# Patient Record
Sex: Female | Born: 1995 | Hispanic: Yes | Marital: Single | Smoking: Never smoker
Health system: Southern US, Community
[De-identification: ages and names within clinical notes are randomized; demographics above are authoritative.]

## PROBLEM LIST (undated history)

## (undated) ENCOUNTER — Inpatient Hospital Stay (HOSPITAL_COMMUNITY): Payer: Self-pay

## (undated) DIAGNOSIS — N83209 Unspecified ovarian cyst, unspecified side: Secondary | ICD-10-CM

## (undated) DIAGNOSIS — F329 Major depressive disorder, single episode, unspecified: Secondary | ICD-10-CM

## (undated) DIAGNOSIS — F32A Depression, unspecified: Secondary | ICD-10-CM

## (undated) DIAGNOSIS — F419 Anxiety disorder, unspecified: Secondary | ICD-10-CM

## (undated) HISTORY — PX: NO PAST SURGERIES: SHX2092

---
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
## 2017-12-08 ENCOUNTER — Other Ambulatory Visit: Payer: Self-pay | Admitting: Family Medicine

## 2017-12-08 DIAGNOSIS — R102 Pelvic and perineal pain: Secondary | ICD-10-CM

## 2017-12-08 DIAGNOSIS — N926 Irregular menstruation, unspecified: Secondary | ICD-10-CM

## 2018-01-16 ENCOUNTER — Ambulatory Visit
Admission: RE | Admit: 2018-01-16 | Discharge: 2018-01-16 | Disposition: A | Discharge status: Home / Self Care | Payer: Medicaid Other | Source: Ambulatory Visit | Attending: Family Medicine | Admitting: Family Medicine

## 2018-01-16 DIAGNOSIS — N926 Irregular menstruation, unspecified: Secondary | ICD-10-CM | POA: Diagnosis present

## 2018-01-16 DIAGNOSIS — R102 Pelvic and perineal pain: Secondary | ICD-10-CM | POA: Insufficient documentation

## 2018-10-26 ENCOUNTER — Encounter: Payer: Self-pay | Admitting: Emergency Medicine

## 2018-10-26 ENCOUNTER — Emergency Department
Admission: EM | Admit: 2018-10-26 | Discharge: 2018-10-27 | Disposition: A | Discharge status: Home / Self Care | Payer: Self-pay | Attending: Emergency Medicine | Admitting: Emergency Medicine

## 2018-10-26 ENCOUNTER — Other Ambulatory Visit: Payer: Self-pay

## 2018-10-26 DIAGNOSIS — Z79899 Other long term (current) drug therapy: Secondary | ICD-10-CM | POA: Insufficient documentation

## 2018-10-26 DIAGNOSIS — R1084 Generalized abdominal pain: Secondary | ICD-10-CM | POA: Insufficient documentation

## 2018-10-26 NOTE — ED Triage Notes (Signed)
Pt arrives ambulatory to triage with c/o lower abdominal pain x 1 day. Pt denies vomiting but states that she has been nauseated all day. Pt is in NAD.

## 2018-10-27 ENCOUNTER — Emergency Department: Payer: Self-pay

## 2018-10-27 LAB — COMPREHENSIVE METABOLIC PANEL
ALBUMIN: 4.7 g/dL (ref 3.5–5.0)
ALT: 14 U/L (ref 0–44)
AST: 22 U/L (ref 15–41)
Alkaline Phosphatase: 76 U/L (ref 38–126)
Anion gap: 10 (ref 5–15)
BILIRUBIN TOTAL: 0.5 mg/dL (ref 0.3–1.2)
BUN: 13 mg/dL (ref 6–20)
CO2: 25 mmol/L (ref 22–32)
CREATININE: 0.72 mg/dL (ref 0.44–1.00)
Calcium: 10.2 mg/dL (ref 8.9–10.3)
Chloride: 101 mmol/L (ref 98–111)
GFR calc Af Amer: >60 mL/min (ref >60)
GLUCOSE: 109 mg/dL — AB (ref 70–99)
POTASSIUM: 2.9 mmol/L — AB (ref 3.5–5.1)
Sodium: 136 mmol/L (ref 135–145)
TOTAL PROTEIN: 9.7 g/dL — AB (ref 6.5–8.1)

## 2018-10-27 LAB — URINALYSIS, COMPLETE (UACMP) WITH MICROSCOPIC
Bilirubin Urine: NEGATIVE
Glucose, UA: NEGATIVE mg/dL
Hgb urine dipstick: NEGATIVE
Ketones, ur: NEGATIVE mg/dL
Nitrite: NEGATIVE
PH: 6 (ref 5.0–8.0)
PROTEIN: NEGATIVE mg/dL
Specific Gravity, Urine: 1.01 (ref 1.005–1.030)

## 2018-10-27 LAB — CBC WITH DIFFERENTIAL/PLATELET
Abs Immature Granulocytes: 0.04 10*3/uL (ref 0.00–0.07)
BASOS ABS: 0 10*3/uL (ref 0.0–0.1)
BASOS PCT: 0 %
Eosinophils Absolute: 0.1 10*3/uL (ref 0.0–0.5)
Eosinophils Relative: 1 %
HCT: 41.9 % (ref 36.0–46.0)
Hemoglobin: 13.6 g/dL (ref 12.0–15.0)
Immature Granulocytes: 0 %
Lymphocytes Relative: 27 %
Lymphs Abs: 2.8 10*3/uL (ref 0.7–4.0)
MCH: 27.4 pg (ref 26.0–34.0)
MCHC: 32.5 g/dL (ref 30.0–36.0)
MCV: 84.5 fL (ref 80.0–100.0)
Monocytes Absolute: 0.5 10*3/uL (ref 0.1–1.0)
Monocytes Relative: 5 %
NEUTROS ABS: 6.7 10*3/uL (ref 1.7–7.7)
NRBC: 0 % (ref 0.0–0.2)
Neutrophils Relative %: 67 %
Platelets: 265 10*3/uL (ref 150–400)
RBC: 4.96 MIL/uL (ref 3.87–5.11)
RDW: 13.6 % (ref 11.5–15.5)
WBC: 10.2 10*3/uL (ref 4.0–10.5)

## 2018-10-27 LAB — LIPASE, BLOOD: Lipase: 32 U/L (ref 11–51)

## 2018-10-27 LAB — POCT PREGNANCY, URINE: PREG TEST UR: NEGATIVE

## 2018-10-27 MED ORDER — ONDANSETRON HCL 4 MG/2ML IJ SOLN
INTRAMUSCULAR | Status: AC
  Filled 2018-10-27: qty 2

## 2018-10-27 MED ORDER — ONDANSETRON HCL 4 MG/2ML IJ SOLN
4.0000 mg | Freq: Once | INTRAMUSCULAR | Status: AC
  Administered 2018-10-27: 4 mg via INTRAVENOUS

## 2018-10-27 MED ORDER — IOPAMIDOL (ISOVUE-300) INJECTION 61%
100.0000 mL | Freq: Once | INTRAVENOUS | Status: AC | PRN
  Administered 2018-10-27: 100 mL via INTRAVENOUS

## 2018-10-27 MED ORDER — IOPAMIDOL (ISOVUE-300) INJECTION 61%
15.0000 mL | INTRAVENOUS | Status: AC
  Administered 2018-10-27 (×2): 15 mL via ORAL

## 2018-10-27 NOTE — ED Provider Notes (Signed)
[facility] Emergency Department Provider Note   ____________________________________________   First MD Initiated Contact with Patient 10/26/18 2347     (approximate)  I have reviewed the triage vital signs and the nursing notes.   HISTORY  Chief Complaint Abdominal Pain   HPI Rachel Mathews is a 22 y.o. female patient reports she was woken up by abdominal pain at 4:00 this morning.  Pain has persisted in spite of Pepto-Bismol and Maalox.  Patient has a small amount of nausea but has not vomited and has not had diarrhea.  Pain is tight and occasionally sharp.  It does not feel crampy.  It is centered in the middle of the abdomen including and just above the umbilicus.  Nothing seems to make it better or worse.  Patient is not sure what brought it on.  History reviewed. No pertinent past medical history.  There are no active problems to display for this patient.   History reviewed. No pertinent surgical history.  Prior to Admission medications   Medication Sig Start Date End Date Taking? Authorizing Provider  Triamcinolone Acetonide (TRIAMCINOLONE 0.1 % CREAM : EUCERIN) CREA Apply 1 application topically 2 (two) times daily as needed. 1 pound. 50/50 mix 10/23/14   Rodolph Bongorey, Evan S, MD    Allergies Patient has no known allergies.  No family history on file.  Social History Social History   Tobacco Use  . Smoking status: Never Smoker  . Smokeless tobacco: Never Used  Substance Use Topics  . Alcohol use: No  . Drug use: Never    Review of Systems  Constitutional: No fever/chills Eyes: No visual changes. ENT: No sore throat. Cardiovascular: Denies chest pain. Respiratory: Denies shortness of breath. Gastrointestinal:abdominal pain.   nausea, no vomiting.  No diarrhea.  No constipation. Genitourinary: Negative for dysuria. Musculoskeletal: Negative for back pain. Skin: Negative for rash. Neurological: Negative for headaches, focal  weakness   ____________________________________________   PHYSICAL EXAM:  VITAL SIGNS: ED Triage Vitals  Enc Vitals Group     BP 10/26/18 2342 135/75     Pulse Rate 10/26/18 2342 95     Resp 10/26/18 2342 18     Temp 10/26/18 2342 98.2 F (36.8 C)     Temp Source 10/26/18 2342 Oral     SpO2 10/26/18 2342 98 %     Weight 10/26/18 2340 120 lb (54.4 kg)     Height 10/26/18 2340 5\' 2"  (1.575 m)     Head Circumference --      Peak Flow --      Pain Score 10/26/18 2340 5     Pain Loc --      Pain Edu? --      Excl. in GC? --     Constitutional: Alert and oriented. Well appearing and in no acute distress. Eyes: Conjunctivae are normal.  Head: Atraumatic. Nose: No congestion/rhinnorhea. Mouth/Throat: Mucous membranes are moist.  Oropharynx non-erythematous. Neck: No stridor. Cardiovascular: Normal rate, regular rhythm. Grossly normal heart sounds.  Good peripheral circulation. Respiratory: Normal respiratory effort.  No retractions. Lungs CTAB. Gastrointestinal: Soft diffusely mildly tender.  No suprapubic tenderness no right lower quadrant tenderness no masses.  No distention. No abdominal bruits. No CVA tenderness. Musculoskeletal: No lower extremity tenderness nor edema.  No joint effusions. Neurologic:  Normal speech and language. No gross focal neurologic deficits are appreciated.  Skin:  Skin is warm, dry and intact. No rash noted. Psychiatric: Mood and affect are normal. Speech and behavior are normal.  ____________________________________________   LABS (all labs ordered are listed, but only abnormal results are displayed)  Labs Reviewed  COMPREHENSIVE METABOLIC PANEL - Abnormal; Notable for the following components:      Result Value   Potassium 2.9 (*)    Glucose, Bld 109 (*)    Total Protein 9.7 (*)    All other components within normal limits  URINALYSIS, COMPLETE (UACMP) WITH MICROSCOPIC - Abnormal; Notable for the following components:   Color, Urine  YELLOW (*)    APPearance HAZY (*)    Leukocytes, UA SMALL (*)    Bacteria, UA RARE (*)    All other components within normal limits  LIPASE, BLOOD  CBC WITH DIFFERENTIAL/PLATELET  POC URINE PREG, ED  POCT PREGNANCY, URINE   ____________________________________________  EKG   ____________________________________________  RADIOLOGY  ED MD interpretation:  Official radiology report(s): Ct Abdomen Pelvis W Contrast  Result Date: 10/27/2018 CLINICAL DATA:  Acute abdominal pain. EXAM: CT ABDOMEN AND PELVIS WITH CONTRAST TECHNIQUE: Multidetector CT imaging of the abdomen and pelvis was performed using the standard protocol following bolus administration of intravenous contrast. CONTRAST:  ISOVUE-300 IOPAMIDOL (ISOVUE-300) INJECTION 61% COMPARISON:  Pelvic ultrasound 01/16/2018 FINDINGS: Lower chest: The lung bases are clear. Hepatobiliary: No focal liver abnormality is seen. No gallstones, gallbladder wall thickening, or biliary dilatation. Pancreas: No ductal dilatation or inflammation. Spleen: Normal in size without focal abnormality. Tiny splenule inferiorly. Adrenals/Urinary Tract: Normal adrenal glands. No hydronephrosis or perinephric edema. Homogeneous renal enhancement. Urinary bladder is physiologically distended without wall thickening. Stomach/Bowel: Stomach is within normal limits. Appendix is normal, for example image 32 series 5. No evidence of bowel wall thickening, distention, or inflammatory changes. Administered enteric contrast reaches the hepatic flexure of the colon. Moderate stool burden. No abnormal rectal distention. Vascular/Lymphatic: No significant vascular findings are present. No enlarged abdominal or pelvic lymph nodes. Reproductive: Simple 3.8 cm cyst in the left ovary is most likely benign. Uterus and right ovary appear normal. Trace free fluid in the pelvis. Other: No free air. No abdominal ascites. Musculoskeletal: There are no acute or suspicious osseous  abnormalities. IMPRESSION: 1. Simple 3.8 cm cyst in the left ovary, most certainly benign. In the absence of left lower quadrant symptoms, no additional imaging is needed. 2. No other acute abnormality in the abdomen/pelvis. Electronically Signed   By: Narda Rutherford M.D.   On: 10/27/2018 03:09    ____________________________________________   PROCEDURES  Procedure(s) performed:  Procedures  Critical Care performed:   ____________________________________________   INITIAL IMPRESSION / ASSESSMENT AND PLAN / ED COURSE  CT shows an ovarian cyst and some stool in the colon.  Reexamined the patient's abdomen shows the pain is improved there is no pain in the left lower quadrant.  Most of the pain has been diffuse and higher up in the abdomen.  I do not believe we need to work on evaluating the cyst.  Patient can follow-up with her OB/GYN doctor for that.  She will return here if she is worse.  We will try an over-the-counter laxative to see if that helps as well.        ____________________________________________   FINAL CLINICAL IMPRESSION(S) / ED DIAGNOSES  Final diagnoses:  Generalized abdominal pain     ED Discharge Orders    None       Note:  This document was prepared using Dragon voice recognition software and may include unintentional dictation errors.    Arnaldo Natal, MD 10/27/18 [phone]

## 2018-10-27 NOTE — ED Notes (Signed)
Pt returned from CT, vomited once

## 2018-10-27 NOTE — Discharge Instructions (Addendum)
Please return for worse pain fever vomiting.  The CAT scan only showed some stool in the colon and an ovarian cyst.  You could try an over-the-counter laxative see if that helps.  If it does not or if you get any worse please return to follow-up with your regular doctor.  You could also follow-up with your OB/GYN for the ovarian cyst that we saw on CT scan.

## 2018-10-27 NOTE — ED Notes (Signed)
Patient transported to CT 

## 2018-10-27 NOTE — ED Notes (Signed)
ED Provider at bedside.  Pt reports peri-umbilicus sharp cramping pain since 4am Friday, pt denies N/V/D/ or dysuria, pt denies any abdominal surgery, L[facility] irregular - [facility]  Family at bedside

## 2018-10-27 NOTE — ED Notes (Signed)
Peripheral IV discontinued. Catheter intact. No signs of infiltration or redness. Gauze applied to IV site.   Discharge instructions reviewed with patient. Questions fielded by this RN. Patient verbalizes understanding of instructions. Patient discharged home in stable condition per Dr Malinda. No acute distress noted at time of discharge.    

## 2019-01-16 IMAGING — CT CT ABD-PELV W/ CM
2 of 4 series · 15 of 46 positions shown, 17 images · IV contrast (APPLIED)
Comparison: Pelvic ultrasound 01/16/2018

CLINICAL DATA: Acute abdominal pain.

EXAM:
CT ABDOMEN AND PELVIS WITH CONTRAST
TECHNIQUE: Multidetector CT imaging of the abdomen and pelvis was performed
using the standard protocol following bolus administration of
intravenous contrast.
CONTRAST:  100mL LXC8TL-MXX IOPAMIDOL (LXC8TL-MXX) INJECTION 61%

[Series 2: routine abd/pel with · axial · 0.66mm/px · z∈[-932,-506]mm · 12 of 97 slices shown, 14 images]
[im 8/97  soft-tissue]
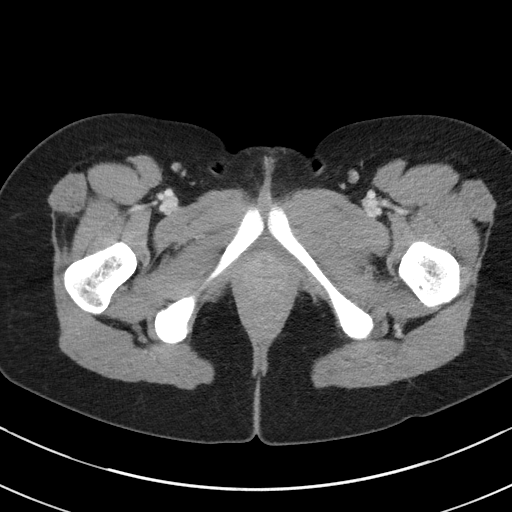
[im 8/97  bone]
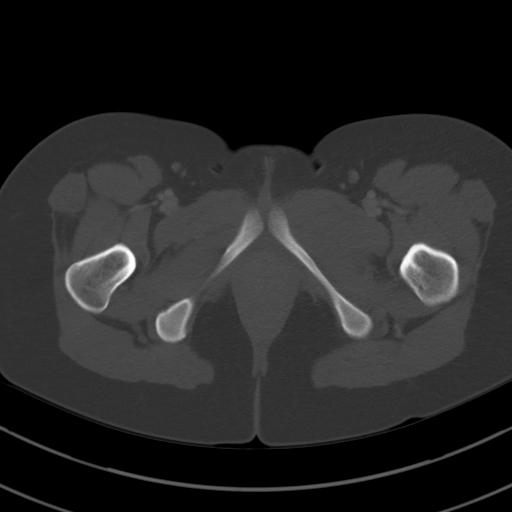
[im 16/97  soft-tissue]
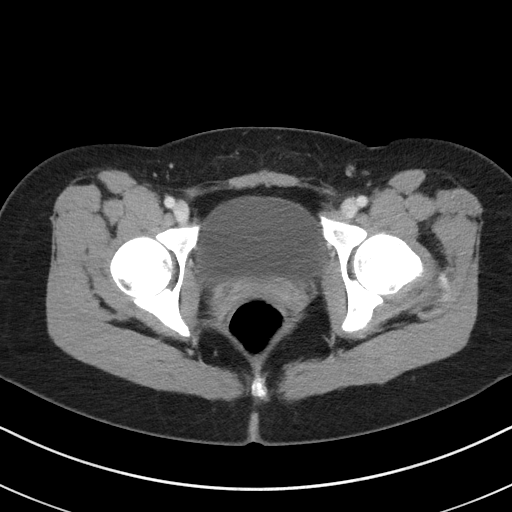
[im 24/97  soft-tissue]
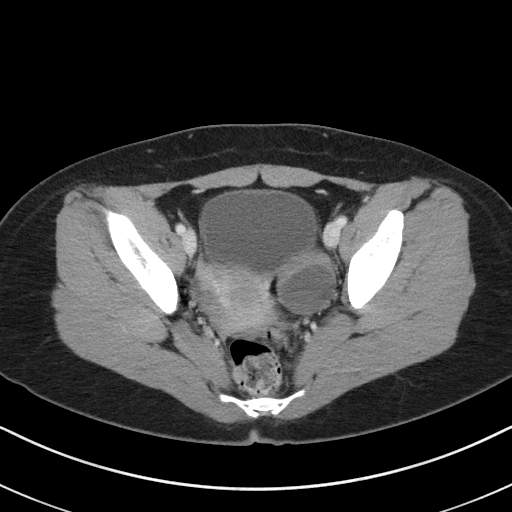
[im 31/97  soft-tissue]
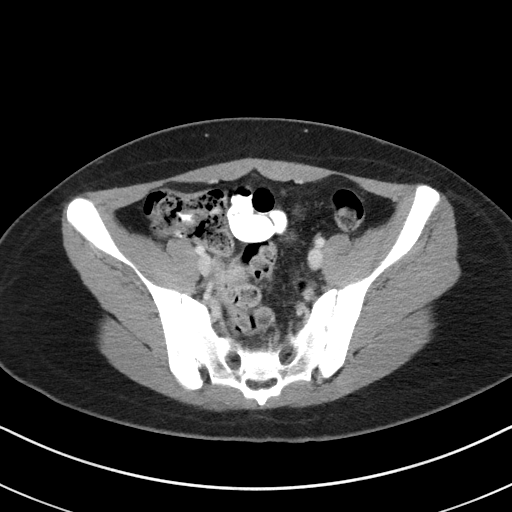
[im 39/97  soft-tissue]
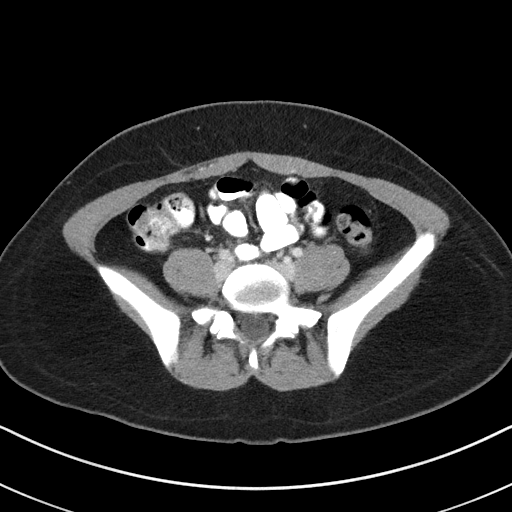
[im 47/97  soft-tissue]
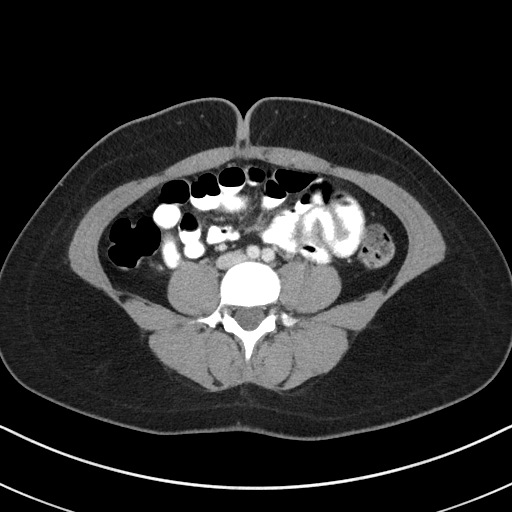
[im 54/97  soft-tissue]
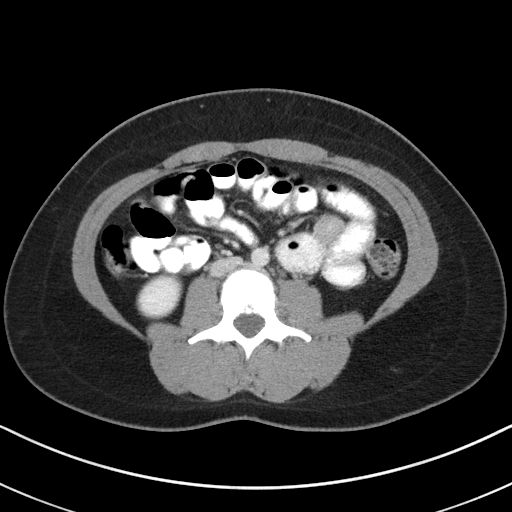
[im 62/97  soft-tissue]
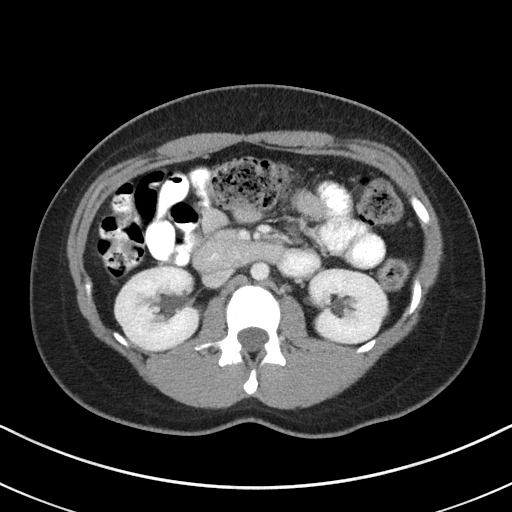
[im 70/97  soft-tissue]
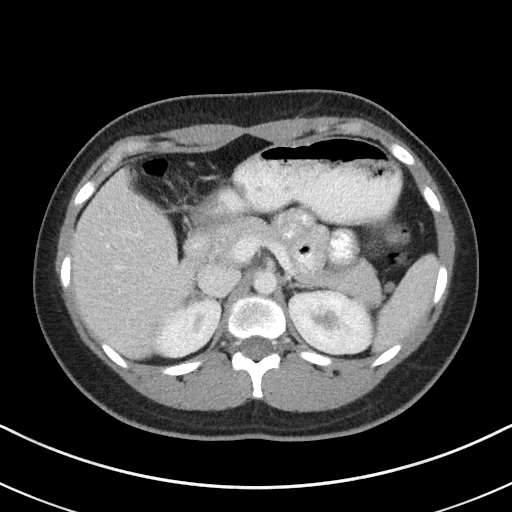
[im 70/97  bone]
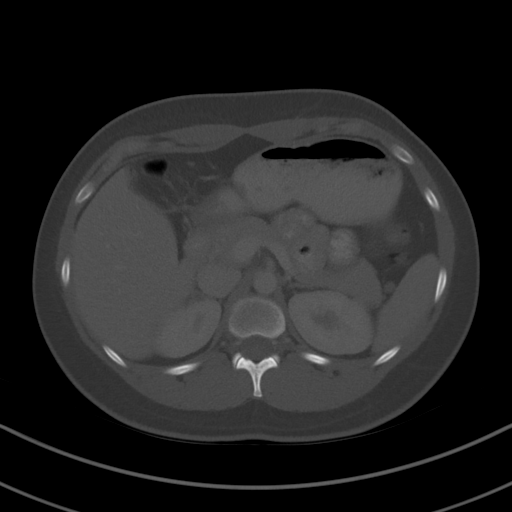
[im 77/97  soft-tissue]
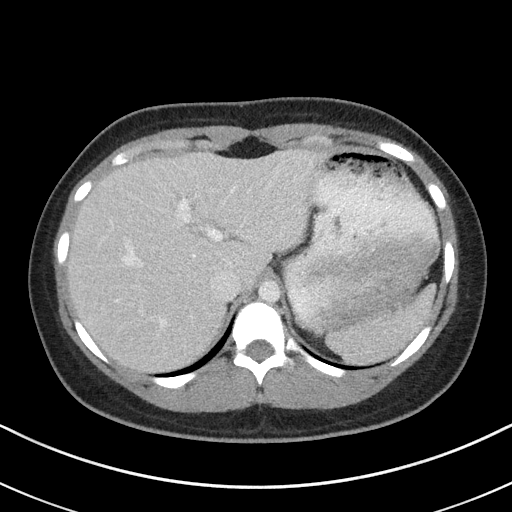
[im 85/97  soft-tissue]
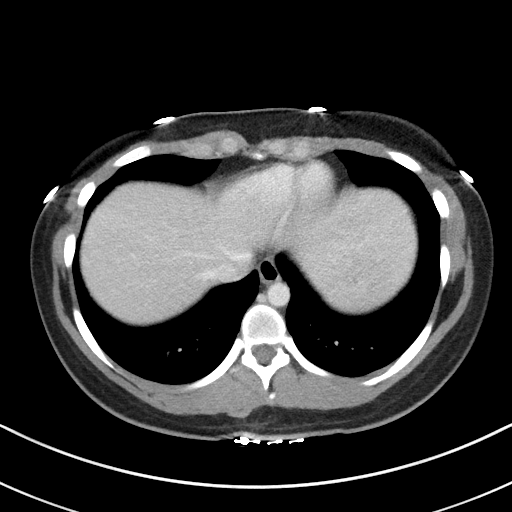
[im 93/97  soft-tissue]
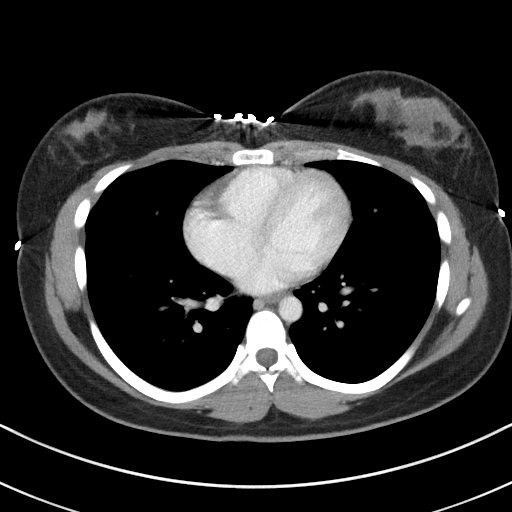

[Series 5: coronal st · coronal · 0.66mm/px · 3 of 69 slices shown]
[im 23/69  soft-tissue]
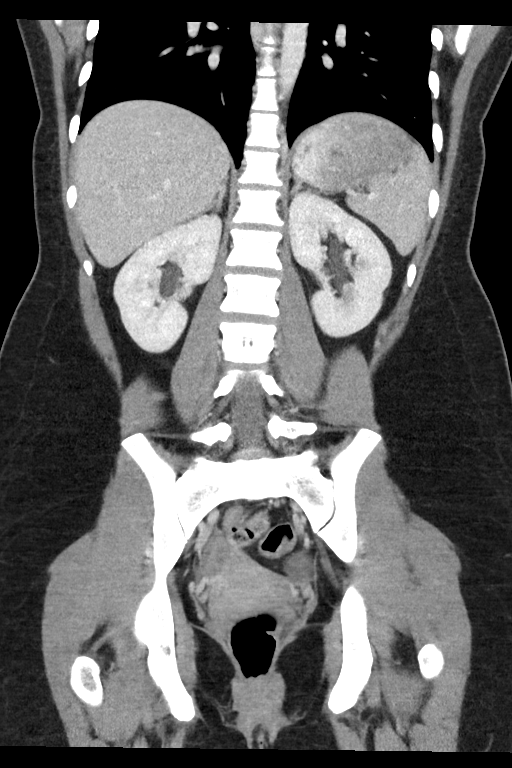
[im 31/69  soft-tissue]
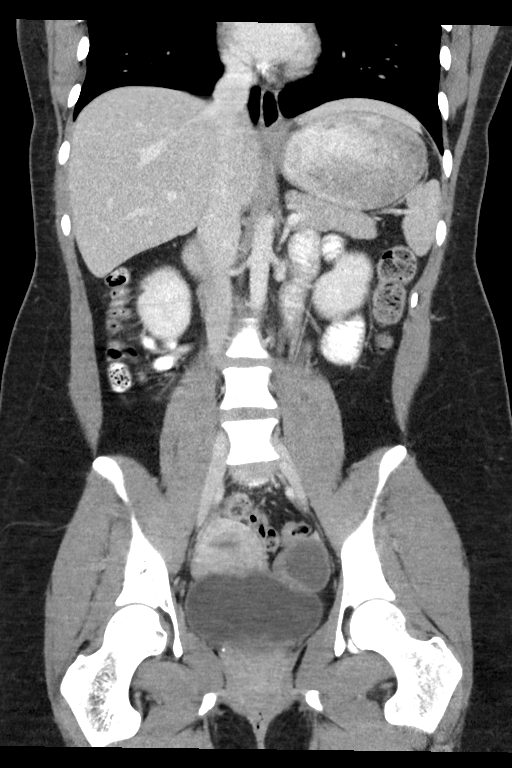
[im 38/69  soft-tissue]
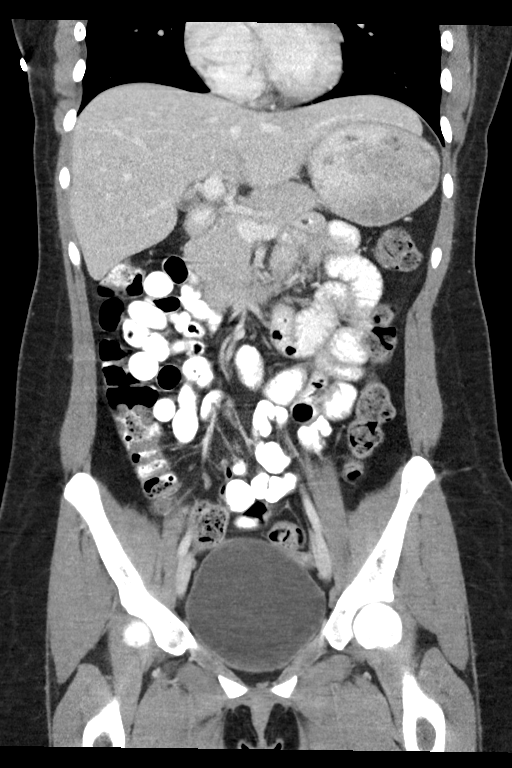

[15 of 46 positions shown; findings below may reference images not displayed]

FINDINGS: Lower chest: The lung bases are clear.

Hepatobiliary: No focal liver abnormality is seen. No gallstones,
gallbladder wall thickening, or biliary dilatation.

Pancreas: No ductal dilatation or inflammation.

Spleen: Normal in size without focal abnormality. Tiny splenule
inferiorly.

Adrenals/Urinary Tract: Normal adrenal glands. No hydronephrosis or
perinephric edema. Homogeneous renal enhancement. Urinary bladder is
physiologically distended without wall thickening.

Stomach/Bowel: Stomach is within normal limits. Appendix is normal,
for example image 32 series 5. No evidence of bowel wall thickening,
distention, or inflammatory changes. Administered enteric contrast
reaches the hepatic flexure of the colon. Moderate stool burden. No
abnormal rectal distention.

Vascular/Lymphatic: No significant vascular findings are present. No
enlarged abdominal or pelvic lymph nodes.

Reproductive: Simple 3.8 cm cyst in the left ovary is most likely
benign. Uterus and right ovary appear normal. Trace free fluid in
the pelvis.

Other: No free air. No abdominal ascites.

Musculoskeletal: There are no acute or suspicious osseous
abnormalities.
IMPRESSION: 1. Simple 3.8 cm cyst in the left ovary, most certainly benign. In
the absence of left lower quadrant symptoms, no additional imaging
is needed.
2. No other acute abnormality in the abdomen/pelvis.

## 2019-01-31 ENCOUNTER — Other Ambulatory Visit: Payer: Self-pay

## 2019-01-31 ENCOUNTER — Encounter (HOSPITAL_COMMUNITY): Payer: Self-pay | Admitting: *Deleted

## 2019-01-31 ENCOUNTER — Inpatient Hospital Stay (HOSPITAL_COMMUNITY)
Admission: AD | Admit: 2019-01-31 | Discharge: 2019-01-31 | Disposition: A | Discharge status: Home / Self Care | Payer: BLUE CROSS/BLUE SHIELD | Attending: Obstetrics & Gynecology | Admitting: Obstetrics & Gynecology

## 2019-01-31 ENCOUNTER — Inpatient Hospital Stay (HOSPITAL_COMMUNITY): Payer: BLUE CROSS/BLUE SHIELD

## 2019-01-31 DIAGNOSIS — R1031 Right lower quadrant pain: Secondary | ICD-10-CM | POA: Diagnosis not present

## 2019-01-31 DIAGNOSIS — N9489 Other specified conditions associated with female genital organs and menstrual cycle: Secondary | ICD-10-CM | POA: Insufficient documentation

## 2019-01-31 DIAGNOSIS — M549 Dorsalgia, unspecified: Secondary | ICD-10-CM | POA: Insufficient documentation

## 2019-01-31 DIAGNOSIS — M79605 Pain in left leg: Secondary | ICD-10-CM | POA: Diagnosis not present

## 2019-01-31 DIAGNOSIS — M79604 Pain in right leg: Secondary | ICD-10-CM | POA: Diagnosis not present

## 2019-01-31 DIAGNOSIS — O26891 Other specified pregnancy related conditions, first trimester: Secondary | ICD-10-CM

## 2019-01-31 DIAGNOSIS — R102 Pelvic and perineal pain: Secondary | ICD-10-CM | POA: Diagnosis not present

## 2019-01-31 DIAGNOSIS — O26899 Other specified pregnancy related conditions, unspecified trimester: Secondary | ICD-10-CM

## 2019-01-31 DIAGNOSIS — Z3A09 9 weeks gestation of pregnancy: Secondary | ICD-10-CM

## 2019-01-31 HISTORY — DX: Unspecified ovarian cyst, unspecified side: N83.209

## 2019-01-31 HISTORY — DX: Depression, unspecified: F32.A

## 2019-01-31 HISTORY — DX: Anxiety disorder, unspecified: F41.9

## 2019-01-31 HISTORY — DX: Major depressive disorder, single episode, unspecified: F32.9

## 2019-01-31 LAB — URINALYSIS, ROUTINE W REFLEX MICROSCOPIC
Bilirubin Urine: NEGATIVE
GLUCOSE, UA: NEGATIVE mg/dL
Hgb urine dipstick: NEGATIVE
Ketones, ur: NEGATIVE mg/dL
Leukocytes,Ua: NEGATIVE
Nitrite: NEGATIVE
PROTEIN: NEGATIVE mg/dL
Specific Gravity, Urine: 1.019 (ref 1.005–1.030)
pH: 6 (ref 5.0–8.0)

## 2019-01-31 LAB — CBC
HEMATOCRIT: 35.9 % — AB (ref 36.0–46.0)
Hemoglobin: 11.9 g/dL — ABNORMAL LOW (ref 12.0–15.0)
MCH: 28.1 pg (ref 26.0–34.0)
MCHC: 33.1 g/dL (ref 30.0–36.0)
MCV: 84.9 fL (ref 80.0–100.0)
Platelets: 260 10*3/uL (ref 150–400)
RBC: 4.23 MIL/uL (ref 3.87–5.11)
RDW: 13.6 % (ref 11.5–15.5)
WBC: 9.2 10*3/uL (ref 4.0–10.5)
nRBC: 0 % (ref 0.0–0.2)

## 2019-01-31 LAB — WET PREP, GENITAL
Clue Cells Wet Prep HPF POC: NONE SEEN
Sperm: NONE SEEN
Trich, Wet Prep: NONE SEEN
Yeast Wet Prep HPF POC: NONE SEEN

## 2019-01-31 LAB — HCG, QUANTITATIVE, PREGNANCY: HCG, BETA CHAIN, QUANT, S: 140297 m[IU]/mL — AB (ref <5)

## 2019-01-31 LAB — POCT PREGNANCY, URINE: Preg Test, Ur: POSITIVE — AB

## 2019-01-31 MED ORDER — PRENATAL VITAMIN 27-0.8 MG PO TABS: 1.0000 | ORAL_TABLET | Freq: Every day | ORAL | 12 refills | Status: DC

## 2019-01-31 NOTE — MAU Note (Addendum)
Pt presents to MAU c/o lower right sided "ovary pain." pt reports the pain started on the right side and has progressively moved over to her left side and along the lower part of her abdomen. Pt states she was having back pain yesterday as well which made her worry about a potential miscarriage because last April she had the same symptoms and had a miscarriage. Pt denies bleeding or vaginal discharge at this time. Pt reports the pain is a 5/10 on the pain score pt states it is a sharp pain that is leading down her legs bilaterally and making them numb to her knees.   Pt is unsure of LMP because she was on [facility] but then stopped due to having some pain and her provider stating that it could have been due to the dose of hormone.

## 2019-01-31 NOTE — MAU Provider Note (Signed)
Chief Complaint: Abdominal Pain and Back Pain   First Provider Initiated Contact with Patient 01/31/19 2104        SUBJECTIVE HPI: Rachel Mathews is a 23 y.o. No obstetric history on file. at Unknown by LMP who presents to maternity admissions reporting pain in right lower quadrant that moved to left lower quadrant.  No bleeding.  Worried about miscarriage.  Was on OCPs and never had periods.  So unsure of LMP. She denies vaginal bleeding, vaginal itching/burning, urinary symptoms, h/a, dizziness, n/v, or fever/chills.    RN Note: Pt presents to MAU c/o lower right sided "ovary pain." pt reports the pain started on the right side and has progressively moved over to her left side and along the lower part of her abdomen. Pt states she was having back pain yesterday as well which made her worry about a potential miscarriage because last April she had the same symptoms and had a miscarriage. Pt denies bleeding or vaginal discharge at this time. Pt reports the pain is a 5/10 on the pain score pt states it is a sharp pain that is leading down her legs bilaterally and making them numb to her knees.   Pt is unsure of LMP because she was on [facility] but then stopped due to having some pain and her provider stating that it could have been due to the dose of hormone.   No past medical history on file. No past surgical history on file. Social History   Socioeconomic History  . Marital status: Single    Spouse name: Not on file  . Number of children: Not on file  . Years of education: Not on file  . Highest education level: Not on file  Occupational History  . Not on file  Social Needs  . Financial resource strain: Not on file  . Food insecurity:    Worry: Not on file    Inability: Not on file  . Transportation needs:    Medical: Not on file    Non-medical: Not on file  Tobacco Use  . Smoking status: Never Smoker  . Smokeless tobacco: Never Used  Substance and Sexual Activity  . Alcohol  use: No  . Drug use: Never  . Sexual activity: Not on file  Lifestyle  . Physical activity:    Days per week: Not on file    Minutes per session: Not on file  . Stress: Not on file  Relationships  . Social connections:    Talks on phone: Not on file    Gets together: Not on file    Attends religious service: Not on file    Active member of club or organization: Not on file    Attends meetings of clubs or organizations: Not on file    Relationship status: Not on file  . Intimate partner violence:    Fear of current or ex partner: Not on file    Emotionally abused: Not on file    Physically abused: Not on file    Forced sexual activity: Not on file  Other Topics Concern  . Not on file  Social History Narrative  . Not on file   No current facility-administered medications on file prior to encounter.    Current Outpatient Medications on File Prior to Encounter  Medication Sig Dispense Refill  . Triamcinolone Acetonide (TRIAMCINOLONE 0.1 % CREAM : EUCERIN) CREA Apply 1 application topically 2 (two) times daily as needed. 1 pound. 50/50 mix 1 each 1  No Known Allergies  I have reviewed patient's Past Medical Hx, Surgical Hx, Family Hx, Social Hx, medications and allergies.   ROS:  Review of Systems  Constitutional: Negative for chills and fever.  Respiratory: Negative for shortness of breath.   Cardiovascular: Negative for leg swelling.  Gastrointestinal: Positive for abdominal pain. Negative for constipation, diarrhea and nausea.  Genitourinary: Positive for pelvic pain. Negative for vaginal bleeding and vaginal discharge.  Musculoskeletal: Negative for back pain.   Review of Systems  Other systems negative   Physical Exam  Physical Exam Patient Vitals for the past 24 hrs:  BP Temp Temp src Pulse Resp Height Weight  01/31/19 2053 136/68 98.4 F (36.9 C) Oral 92 17  (1.575 m) 66 kg   Constitutional: Well-developed, well-nourished female in no acute distress.   Cardiovascular: normal rate Respiratory: normal effort GI: Abd soft, non-tender. Pos BS x 4 MS: Extremities nontender, no edema, normal ROM Neurologic: Alert and oriented x 4.  GU: Neg CVAT.  PELVIC EXAM: Cervix pink, visually closed, without lesion, scant white creamy discharge, vaginal walls and external genitalia normal Bimanual exam: Cervix 0/long/high, firm, anterior, neg CMT, uterus nontender, nonenlarged, adnexa with mild tenderness bilaterally   LAB RESULTS Results for orders placed or performed during the hospital encounter of 01/31/19 (from the past 24 hour(s))  Urinalysis, Routine w reflex microscopic     Status: Abnormal   Collection Time: 01/31/19  8:35 PM  Result Value Ref Range   Color, Urine YELLOW YELLOW   APPearance HAZY (A) CLEAR   Specific Gravity, Urine 1.019 1.005 - 1.030   pH 6.0 5.0 - 8.0   Glucose, UA NEGATIVE NEGATIVE mg/dL   Hgb urine dipstick NEGATIVE NEGATIVE   Bilirubin Urine NEGATIVE NEGATIVE   Ketones, ur NEGATIVE NEGATIVE mg/dL   Protein, ur NEGATIVE NEGATIVE mg/dL   Nitrite NEGATIVE NEGATIVE   Leukocytes,Ua NEGATIVE NEGATIVE  Pregnancy, urine POC     Status: Abnormal   Collection Time: 01/31/19  8:43 PM  Result Value Ref Range   Preg Test, Ur POSITIVE (A) NEGATIVE  Wet prep, genital     Status: Abnormal   Collection Time: 01/31/19  9:03 PM  Result Value Ref Range   Yeast Wet Prep HPF POC NONE SEEN NONE SEEN   Trich, Wet Prep NONE SEEN NONE SEEN   Clue Cells Wet Prep HPF POC NONE SEEN NONE SEEN   WBC, Wet Prep HPF POC FEW (A) NONE SEEN   Sperm NONE SEEN   CBC     Status: Abnormal   Collection Time: 01/31/19  9:09 PM  Result Value Ref Range   WBC 9.2 4.0 - 10.5 K/uL   RBC 4.23 3.87 - 5.11 MIL/uL   Hemoglobin 11.9 (L) 12.0 - 15.0 g/dL   HCT 16.1 (L) 09.6 - 04.5 %   MCV 84.9 80.0 - 100.0 fL   MCH 28.1 26.0 - 34.0 pg   MCHC 33.1 30.0 - 36.0 g/dL   RDW 40.9 81.1 - 91.4 %   Platelets 260 150 - 400 K/uL   nRBC 0.0 0.0 - 0.2 %     IMAGING US Ob Comp Less 14 Wks  Result Date: 01/31/2019 CLINICAL DATA:  Initial evaluation for acute right-sided abdominal pain, early pregnancy. EXAM: OBSTETRIC <14 WK ULTRASOUND TECHNIQUE: Transabdominal ultrasound was performed for evaluation of the gestation as well as the maternal uterus and adnexal regions. COMPARISON:  None available. FINDINGS: Intrauterine gestational sac: Single Yolk sac:  Present Embryo:  Present Cardiac Activity: Present  Heart Rate: 169 bpm CRL: 24.8 mm 9 w 1 d                  [place] EDC: 09/04/2019 Subchorionic hemorrhage:  None visualized. Maternal uterus/adnexae: Ovaries normal in appearance bilaterally. Adnexal mass or free fluid. IMPRESSION: 1. Single viable intrauterine pregnancy as above without complication, estimated gestational age [redacted] weeks and 1 day by crown-rump length, with ultrasound EDC of 09/04/2019. 2. No other acute maternal uterine or adnexal abnormality identified. Electronically Signed   By: Rise Mu M.D.   On: 01/31/2019 21:50     MAU Management/MDM: Ordered usual first trimester r/o ectopic labs.   Pelvic exam and cultures done Will check baseline Ultrasound to rule out ectopic.  This bleeding/pain can represent a normal pregnancy with bleeding, spontaneous abortion or even an ectopic which can be life-threatening.  The process as listed above helps to determine which of these is present.  Reviewed findings with patient who is pleased Wet prep neg as is urine Discussed pain is likely uterine stretching   ASSESSMENT Pregnancy at Unknown  GA Pelvic pain with early pregnancy Single live intrauterine pregnancy at [redacted]w[redacted]d  PLAN Discharge home Encouraged to seek prenatal care May use warm bath or tylenol for pain  Pt stable at time of discharge. Encouraged to return here or to other Urgent Care/ED if she develops worsening of symptoms, increase in pain, fever, or other concerning symptoms.    Wynelle Bourgeois CNM, MSN Certified  Nurse-Midwife 01/31/2019  9:05 PM

## 2019-01-31 NOTE — Discharge Instructions (Signed)
Abdominal Pain During Pregnancy  Abdominal pain is common during pregnancy, and has many possible causes. Some causes are more serious than others, and sometimes the cause is not known. Abdominal pain can be a sign that labor is starting. It can also be caused by normal growth and stretching of muscles and ligaments during pregnancy. Always tell your health care provider if you have any abdominal pain. Follow these instructions at home:  Do not have sex or put anything in your vagina until your pain goes away completely.  Get plenty of rest until your pain improves.  Drink enough fluid to keep your urine pale yellow.  Take over-the-counter and prescription medicines only as told by your health care provider.  Keep all follow-up visits as told by your health care provider. This is important. Contact a health care provider if:  Your pain continues or gets worse after resting.  You have lower abdominal pain that: ? Comes and goes at regular intervals. ? Spreads to your back. ? Is similar to menstrual cramps.  You have pain or burning when you urinate. Get help right away if:  You have a fever or chills.  You have vaginal bleeding.  You are leaking fluid from your vagina.  You are passing tissue from your vagina.  You have vomiting or diarrhea that lasts for more than 24 hours.  Your baby is moving less than usual.  You feel very weak or faint.  You have shortness of breath.  You develop severe pain in your upper abdomen. Summary  Abdominal pain is common during pregnancy, and has many possible causes.  If you experience abdominal pain during pregnancy, tell your health care provider right away.  Follow your health care provider's home care instructions and keep all follow-up visits as directed. This information is not intended to replace advice given to you by your health care provider. Make sure you discuss any questions you have with your health care  provider. Document Released: 10/31/2005 Document Revised: 02/02/2017 Document Reviewed: 02/02/2017 Elsevier Interactive Patient Education  2019 [employer].  First Trimester of Pregnancy  The first trimester of pregnancy is from week 1 until the end of week 13 (months 1 through 3). During this time, your baby will begin to develop inside you. At 6-8 weeks, the eyes and face are formed, and the heartbeat can be seen on ultrasound. At the end of 12 weeks, all the baby's organs are formed. Prenatal care is all the medical care you receive before the birth of your baby. Make sure you get good prenatal care and follow all of your doctor's instructions. Follow these instructions at home: Medicines  Take over-the-counter and prescription medicines only as told by your doctor. Some medicines are safe and some medicines are not safe during pregnancy.  Take a prenatal vitamin that contains at least 600 micrograms (mcg) of folic acid.  If you have trouble pooping (constipation), take medicine that will make your stool soft (stool softener) if your doctor approves. Eating and drinking   Eat regular, healthy meals.  Your doctor will tell you the amount of weight gain that is right for you.  Avoid raw meat and uncooked cheese.  If you feel sick to your stomach (nauseous) or throw up (vomit): ? Eat 4 or 5 small meals a day instead of 3 large meals. ? Try eating a few soda crackers. ? Drink liquids between meals instead of during meals.  To prevent constipation: ? Eat foods that are high in  fiber, like fresh fruits and vegetables, whole grains, and beans. ? Drink enough fluids to keep your pee (urine) clear or pale yellow. Activity  Exercise only as told by your doctor. Stop exercising if you have cramps or pain in your lower belly (abdomen) or low back.  Do not exercise if it is too hot, too humid, or if you are in a place of great height (high altitude).  Try to avoid standing for long  periods of time. Move your legs often if you must stand in one place for a long time.  Avoid heavy lifting.  Wear low-heeled shoes. Sit and stand up straight.  You can have sex unless your doctor tells you not to. Relieving pain and discomfort  Wear a good support bra if your breasts are sore.  Take warm water baths (sitz baths) to soothe pain or discomfort caused by hemorrhoids. Use hemorrhoid cream if your doctor says it is okay.  Rest with your legs raised if you have leg cramps or low back pain.  If you have puffy, bulging veins (varicose veins) in your legs: ? Wear support hose or compression stockings as told by your doctor. ? Raise (elevate) your feet for 15 minutes, 3-4 times a day. ? Limit salt in your food. Prenatal care  Schedule your prenatal visits by the twelfth week of pregnancy.  Write down your questions. Take them to your prenatal visits.  Keep all your prenatal visits as told by your doctor. This is important. Safety  Wear your seat belt at all times when driving.  Make a list of emergency phone numbers. The list should include numbers for family, friends, the hospital, and police and fire departments. General instructions  Ask your doctor for a referral to a local prenatal class. Begin classes no later than at the start of month 6 of your pregnancy.  Ask for help if you need counseling or if you need help with nutrition. Your doctor can give you advice or tell you where to go for help.  Do not use hot tubs, steam rooms, or saunas.  Do not douche or use tampons or scented sanitary pads.  Do not cross your legs for long periods of time.  Avoid all herbs and alcohol. Avoid drugs that are not approved by your doctor.  Do not use any tobacco products, including cigarettes, chewing tobacco, and electronic cigarettes. If you need help quitting, ask your doctor. You may get counseling or other support to help you quit.  Avoid cat litter boxes and soil used  by cats. These carry germs that can cause birth defects in the baby and can cause a loss of your baby (miscarriage) or stillbirth.  Visit your dentist. At home, brush your teeth with a soft toothbrush. Be gentle when you floss. Contact a doctor if:  You are dizzy.  You have mild cramps or pressure in your lower belly.  You have a nagging pain in your belly area.  You continue to feel sick to your stomach, you throw up, or you have watery poop (diarrhea).  You have a bad smelling fluid coming from your vagina.  You have pain when you pee (urinate).  You have increased puffiness (swelling) in your face, hands, legs, or ankles. Get help right away if:  You have a fever.  You are leaking fluid from your vagina.  You have spotting or bleeding from your vagina.  You have very bad belly cramping or pain.  You gain or lose weight  rapidly.  You throw up blood. It may look like coffee grounds.  You are around people who have [place] measles, fifth disease, or chickenpox.  You have a very bad headache.  You have shortness of breath.  You have any kind of trauma, such as from a fall or a car accident. Summary  The first trimester of pregnancy is from week 1 until the end of week 13 (months 1 through 3).  To take care of yourself and your unborn baby, you will need to eat healthy meals, take medicines only if your doctor tells you to do so, and do activities that are safe for you and your baby.  Keep all follow-up visits as told by your doctor. This is important as your doctor will have to ensure that your baby is healthy and growing well. This information is not intended to replace advice given to you by your health care provider. Make sure you discuss any questions you have with your health care provider. Document Released: 04/18/2008 Document Revised: 11/08/2016 Document Reviewed: 11/08/2016 Elsevier Interactive Patient Education  2019 [employer].

## 2019-02-01 LAB — HIV ANTIBODY (ROUTINE TESTING W REFLEX): HIV SCREEN 4TH GENERATION: NONREACTIVE

## 2019-02-02 LAB — GC/CHLAMYDIA PROBE AMP (~~LOC~~) NOT AT ARMC
Chlamydia: NEGATIVE
Neisseria Gonorrhea: NEGATIVE

## 2019-12-27 IMAGING — US US PELVIS COMPLETE TRANSABD/TRANSVAG
1 series · 13 of 25 positions shown · non-contrast
Comparison: None

CLINICAL DATA: Right pelvic pain and irregular menstruation.



[Series 1: us pelvis complete transabd/transvag · 0.20mm/px · 13 of 75 slices shown]
[im 1/75]
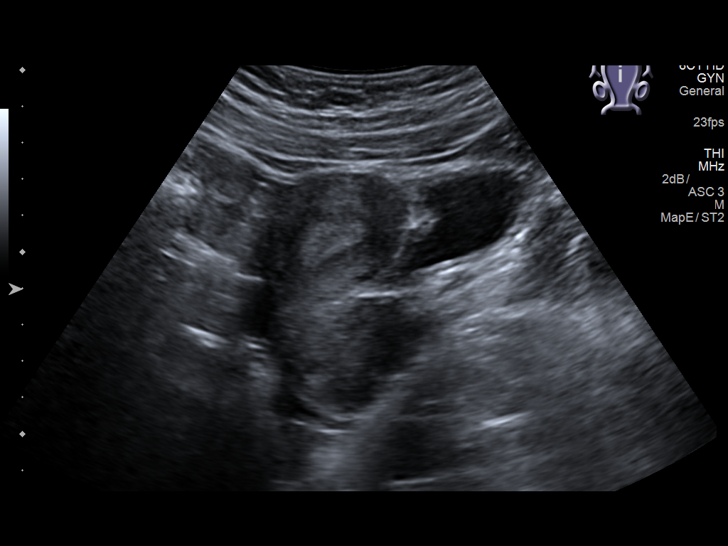
[im 7/75]
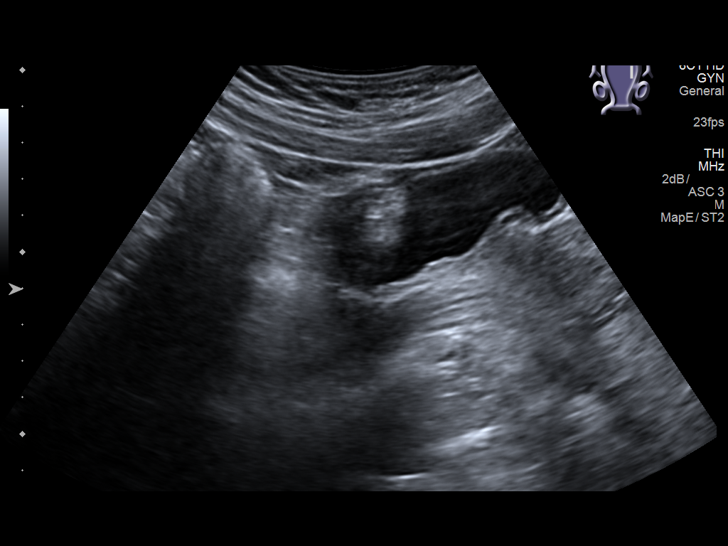
[im 13/75]
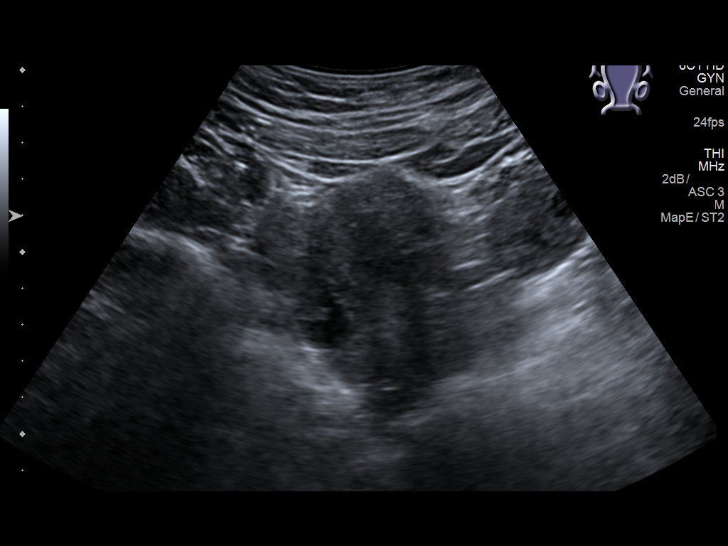
[im 19/75]
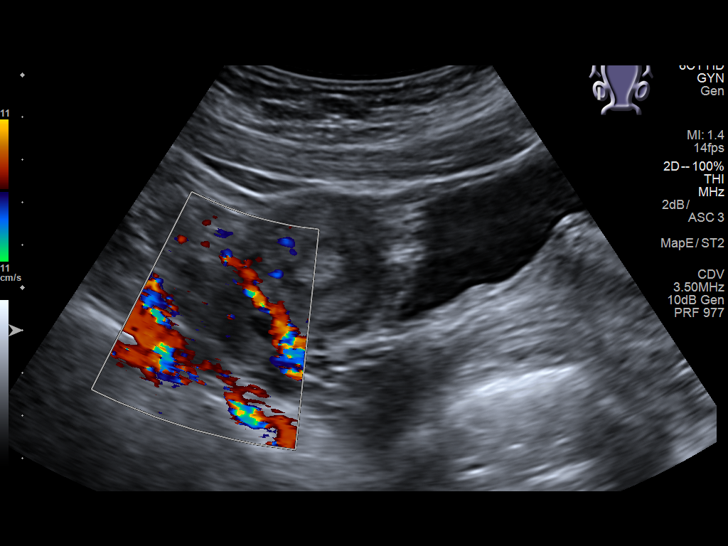
[im 25/75]
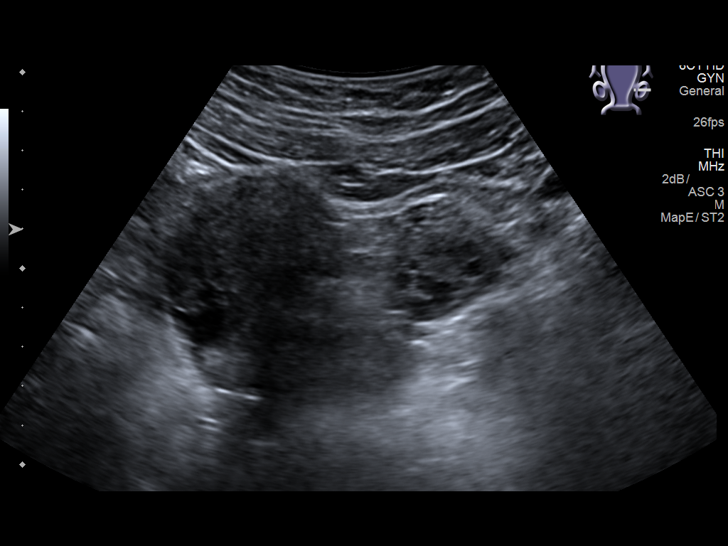
[im 31/75]
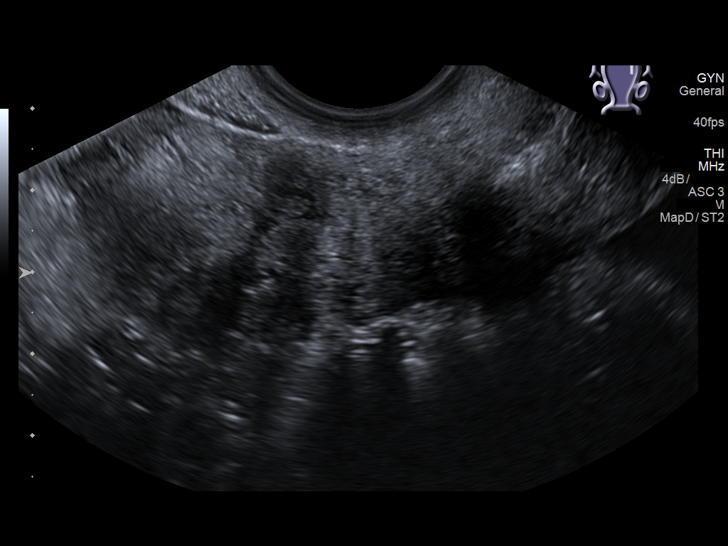
[im 38/75]
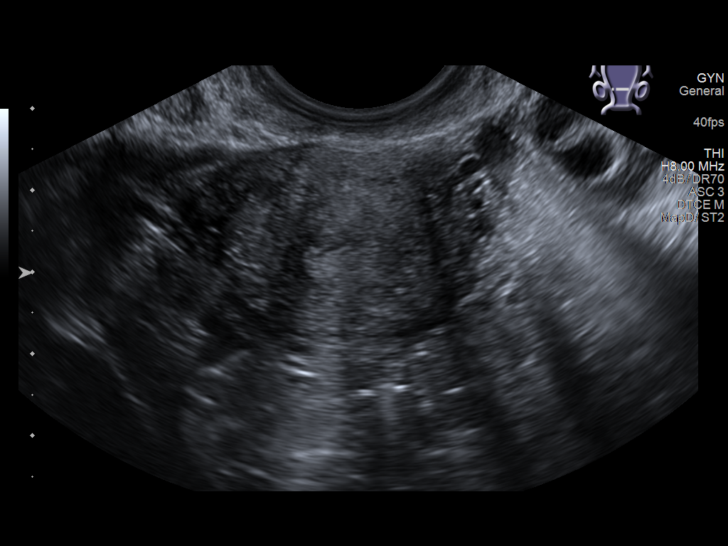
[im 44/75]
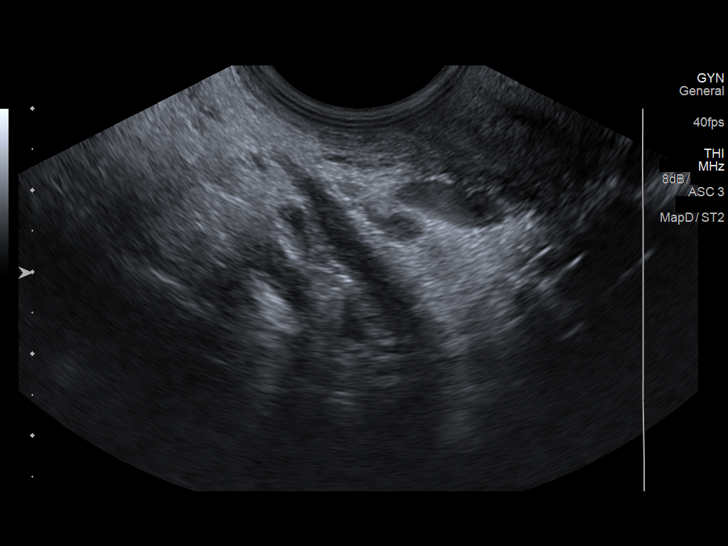
[im 50/75]
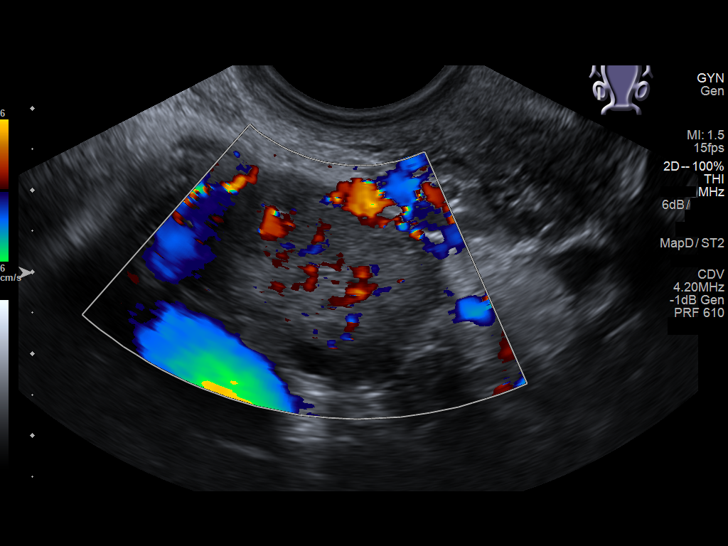
[im 56/75]
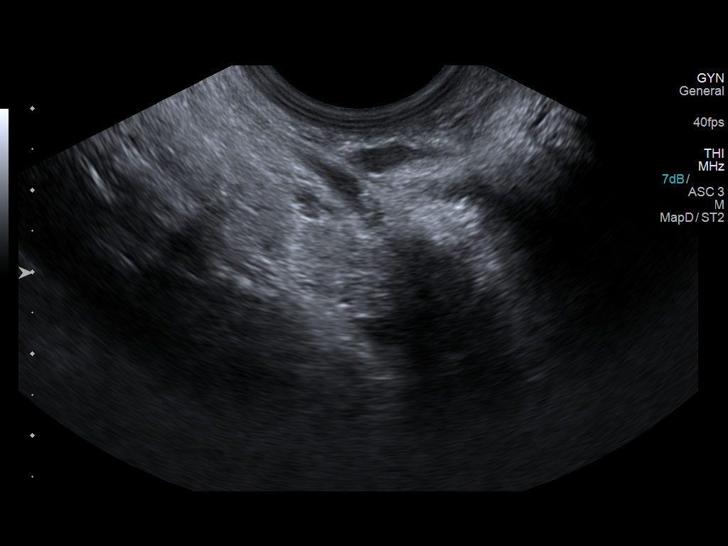
[im 62/75]
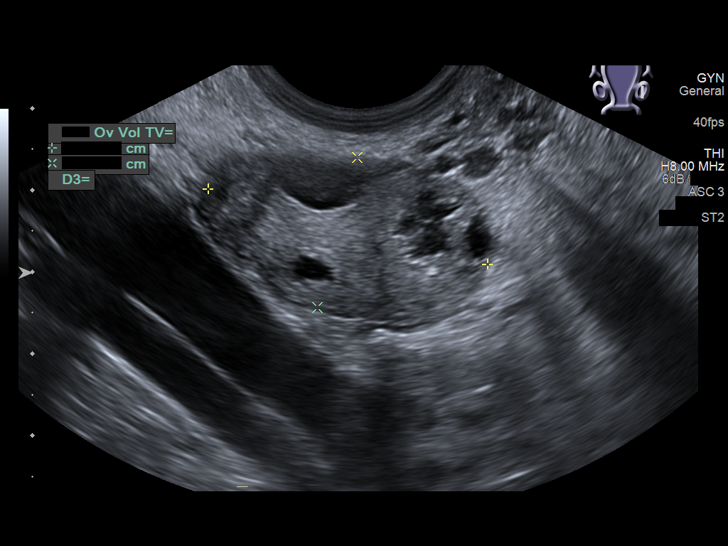
[im 68/75]
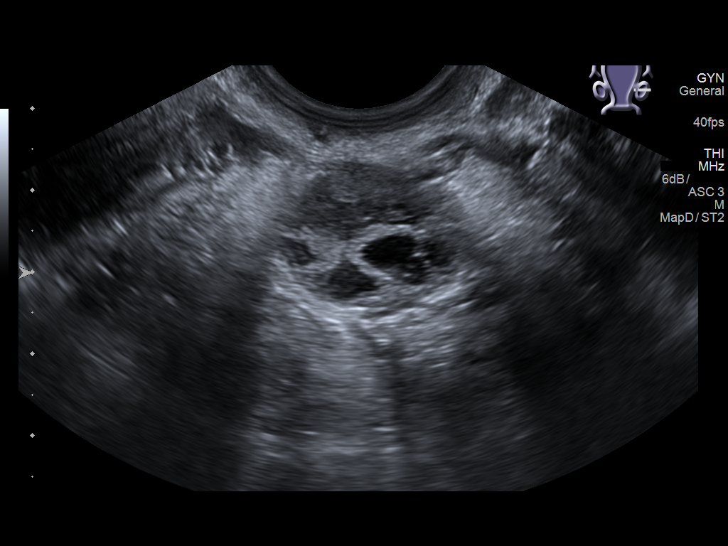
[im 75/75]
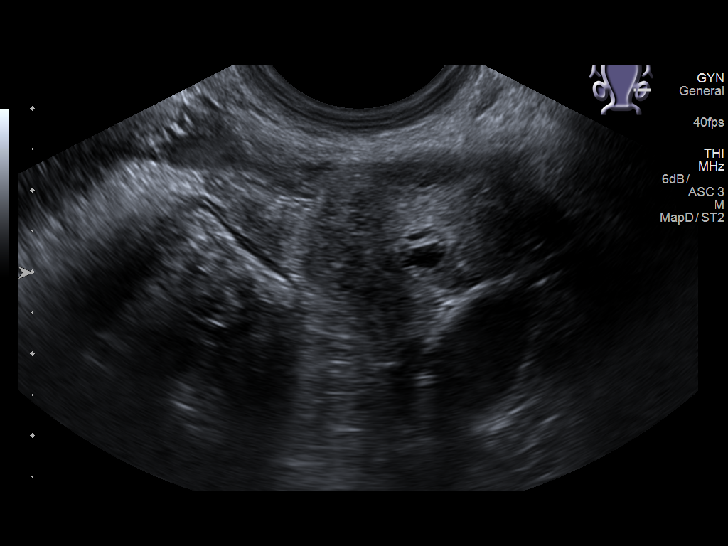

[13 of 25 positions shown; findings below may reference images not displayed]

FINDINGS: Uterus

Measurements: 6.6 x 3.1 x 4.2 cm. No fibroids or other mass
visualized.

Endometrium

Thickness: 10 mm.  No focal abnormality visualized.

Right ovary

Measurements: 2.8 x 2.0 x 1.8 cm. Normal appearance/no adnexal mass.

Left ovary

Measurements: 3.5 x 1.9 x 2.9 cm. 1.1 x 0.8 x 0.9 cm hypoechoic
focus with crenulated borders, benign in appearance and likely
reflecting a corpus luteum.

Other findings

Moderate volume free fluid in the cul-de-sac and left adnexa.
IMPRESSION: 1. Unremarkable appearance of the uterus. Normal endometrial
thickness.
2. No suspicious adnexal lesion. Likely corpus luteum in the left
ovary.
3. Moderate volume pelvic free fluid.

## 2023-04-03 ENCOUNTER — Emergency Department
Admission: EM | Admit: 2023-04-03 | Discharge: 2023-04-04 | Disposition: A | Discharge status: Home / Self Care | Payer: BLUE CROSS/BLUE SHIELD | Attending: Emergency Medicine | Admitting: Emergency Medicine

## 2023-04-03 ENCOUNTER — Emergency Department: Payer: BLUE CROSS/BLUE SHIELD

## 2023-04-03 ENCOUNTER — Other Ambulatory Visit: Payer: Self-pay

## 2023-04-03 DIAGNOSIS — R55 Syncope and collapse: Secondary | ICD-10-CM | POA: Insufficient documentation

## 2023-04-03 DIAGNOSIS — R4020 Unspecified coma: Secondary | ICD-10-CM

## 2023-04-03 MED ORDER — LACTATED RINGERS IV BOLUS
1000.0000 mL | Freq: Once | INTRAVENOUS | Status: AC
  Administered 2023-04-04: 1000 mL via INTRAVENOUS

## 2023-04-03 NOTE — ED Provider Notes (Signed)
[facility] Provider Note    Event Date/Time   First MD Initiated Contact with Patient 04/03/23 2340     (approximate)   History   Loss of Consciousness   HPI  Rachel Mathews is a 27 y.o. female with past medical history of ADHD, depression, ankylosing spondylitis who presents after an episode of loss of consciousness.  Patient was sitting down and bending down to help her boyfriend tie his shoe when she said she suddenly felt very hot and lightheaded like she was going to pass out.  She felt her vision was going out.  Her boyfriend then got her to the bed and at some point she lost consciousness.  She did not fall or hit her head.  She does not think she urinated on herself does not think she bit her tongue.  Per EMS apparently there was some shaking of her shoulders and family was concerned for possible seizure.  Patient denies headache recent injury numbness or weakness.  Denies history of seizure.  No drug or alcohol use.  Patient does not get her menstrual period.  Says otherwise today she was feeling well.  Did not eat and drink much today.  Denies family history of sudden cardiac death or young people with heart issues.     Past Medical History:  Diagnosis Date   Anxiety    Depression    Ovarian cyst     There are no problems to display for this patient.    Physical Exam  Triage Vital Signs: ED Triage Vitals [04/03/23 2338]  Enc Vitals Group     BP (!) 147/93     Pulse Rate 94     Resp 16     Temp      Temp src      SpO2 100 %     Weight      Height      Head Circumference      Peak Flow      Pain Score 0     Pain Loc      Pain Edu?      Excl. in GC?     Most recent vital signs: Vitals:   04/03/23 2338 04/03/23 2342  BP: (!) 147/93   Pulse: 94   Resp: 16   Temp:  98.1 F (36.7 C)  SpO2: 100%      General: Awake, no distress.  CV:  Good peripheral perfusion.  Resp:  Normal effort.  Abd:  No distention.  Neuro:              Awake, Alert, Oriented x 3  Other:  Aox3, nml speech  PERRL, EOMI, face symmetric, nml tongue movement  5/5 strength in the BL upper and lower extremities  Sensation grossly intact in the BL upper and lower extremities  Finger-nose-finger intact BL    ED Results / Procedures / Treatments  Labs (all labs ordered are listed, but only abnormal results are displayed) Labs Reviewed  COMPREHENSIVE METABOLIC PANEL - Abnormal; Notable for the following components:      Result Value   Potassium 3.1 (*)    Glucose, Bld 110 (*)    Calcium 8.5 (*)    All other components within normal limits  CBC WITH DIFFERENTIAL/PLATELET  POC URINE PREG, ED     EKG  I reviewed interpreted the EKG which shows sinus rhythm with normal axis and normal intervals no acute ischemic changes, no signs of WPW, Brugada syndrome,  prolonged QTc, hokum or ARVC   RADIOLOGY I reviewed and interpreted the CT scan of the brain which does not show any acute intracranial process    PROCEDURES:  Critical Care performed: No  Procedures  The patient is on the cardiac monitor to evaluate for evidence of arrhythmia and/or significant heart rate changes.   MEDICATIONS ORDERED IN ED: Medications  potassium chloride SA (KLOR-CON M) CR tablet 40 mEq (has no administration in time range)  lactated ringers bolus 1,000 mL (1,000 mLs Intravenous New Bag/Given 04/04/23 0012)     IMPRESSION / MDM / ASSESSMENT AND PLAN / ED COURSE  I reviewed the triage vital signs and the nursing notes.                              Patient's presentation is most consistent with acute complicated illness / injury requiring diagnostic workup.  Differential diagnosis includes, but is not limited to, vasovagal syncope, orthostatic hypotension, less likely cardiac arrhythmia or structural cardiac abnormality, seizure  The patient is a 27 year old female who is relatively healthy presents today after an episode of loss of  consciousness.  She had been sitting down when had prodrome of lightheadedness warmth and vision going out and apparently lost consciousness.  Family is not here at the time of my evaluation but per EMS she had some shaking of primarily her shoulders they were concerned for seizure-like activity.  There was no seizure-like activity witnessed by fire or EMS but patient was apparently somewhat confused when they arrived.  On my evaluation patient is alert and oriented with nonfocal neurologic exam.  She is mildly hypertensive but vitals are otherwise reassuring.  She feels tired and somewhat globally weak currently but denies headache or any other neurologic symptoms.  This event sounds much more like a syncopal episode given her prodrome of lightheadedness warmth and vision change suspect possible vasovagal episode.  Given the reported shaking of without other collateral information at this time we will workup like first-time seizure for syncope including CT head.  Patient's labs are notable for mild hypokalemia which was supplemented orally.  Discussed this with her and recommended dietary increase in potassium.  CT head CBC and pregnancy test are all reassuring.  Patient feeling well after bolus of fluid.  Suspect syncope likely vasovagal.  Will discharge.  We discussed return precautions.  No indication to start any AED at this time.       FINAL CLINICAL IMPRESSION(S) / ED DIAGNOSES   Final diagnoses:  Loss of consciousness (HCC)     Rx / DC Orders   ED Discharge Orders     None        Note:  This document was prepared using Dragon voice recognition software and may include unintentional dictation errors.   Georga Hacking, MD 04/04/23 [phone]

## 2023-04-03 NOTE — ED Triage Notes (Signed)
Pt arrives EMS from home with reports of possible syncopal episode. Pt reports she was bending down when she got hot and felt like she was going to pass out. Family reports pt began shaking shoulders. Pt reports feeling well all day today. Pt denies any complaints. CBG 88. Pt alert and oriented with VSS on arrival.

## 2023-04-04 LAB — COMPREHENSIVE METABOLIC PANEL
ALT: 17 U/L (ref 0–44)
AST: 20 U/L (ref 15–41)
Albumin: 3.6 g/dL (ref 3.5–5.0)
Alkaline Phosphatase: 56 U/L (ref 38–126)
Anion gap: 7 (ref 5–15)
BUN: 15 mg/dL (ref 6–20)
CO2: 25 mmol/L (ref 22–32)
Calcium: 8.5 mg/dL — ABNORMAL LOW (ref 8.9–10.3)
Chloride: 104 mmol/L (ref 98–111)
Creatinine, Ser: 0.8 mg/dL (ref 0.44–1.00)
GFR, Estimated: >60 mL/min (ref >60)
Glucose, Bld: 110 mg/dL — ABNORMAL HIGH (ref 70–99)
Potassium: 3.1 mmol/L — ABNORMAL LOW (ref 3.5–5.1)
Sodium: 136 mmol/L (ref 135–145)
Total Bilirubin: 0.4 mg/dL (ref 0.3–1.2)
Total Protein: 7.1 g/dL (ref 6.5–8.1)

## 2023-04-04 LAB — CBC WITH DIFFERENTIAL/PLATELET
Abs Immature Granulocytes: 0.02 10*3/uL (ref 0.00–0.07)
Basophils Absolute: 0 10*3/uL (ref 0.0–0.1)
Basophils Relative: 1 %
Eosinophils Absolute: 0.2 10*3/uL (ref 0.0–0.5)
Eosinophils Relative: 2 %
HCT: 39.6 % (ref 36.0–46.0)
Hemoglobin: 12.6 g/dL (ref 12.0–15.0)
Immature Granulocytes: 0 %
Lymphocytes Relative: 26 %
Lymphs Abs: 2.3 10*3/uL (ref 0.7–4.0)
MCH: 28.1 pg (ref 26.0–34.0)
MCHC: 31.8 g/dL (ref 30.0–36.0)
MCV: 88.4 fL (ref 80.0–100.0)
Monocytes Absolute: 0.7 10*3/uL (ref 0.1–1.0)
Monocytes Relative: 8 %
Neutro Abs: 5.6 10*3/uL (ref 1.7–7.7)
Neutrophils Relative %: 63 %
Platelets: 285 10*3/uL (ref 150–400)
RBC: 4.48 MIL/uL (ref 3.87–5.11)
RDW: 13 % (ref 11.5–15.5)
WBC: 8.8 10*3/uL (ref 4.0–10.5)
nRBC: 0 % (ref 0.0–0.2)

## 2023-04-04 LAB — POC URINE PREG, ED: Preg Test, Ur: NEGATIVE

## 2023-04-04 MED ORDER — POTASSIUM CHLORIDE CRYS ER 20 MEQ PO TBCR
40.0000 meq | EXTENDED_RELEASE_TABLET | Freq: Once | ORAL | Status: AC
  Administered 2023-04-04: 40 meq via ORAL
  Filled 2023-04-04: qty 2

## 2023-04-04 NOTE — Discharge Instructions (Signed)
I suspect that you had a syncopal episode and not a true seizure.  Your blood work showed that your potassium was slightly low.  Please review the information above to increase your dietary intake of potassium.  Please make sure you are staying hydrated and when you are standing up that you stand up slowly.
